# Patient Record
Sex: Female | Born: 1935 | Race: White | Hispanic: No | Smoking: Former smoker
Health system: Southern US, Community
[De-identification: ages and names within clinical notes are randomized; demographics above are authoritative.]

## PROBLEM LIST (undated history)

## (undated) DIAGNOSIS — N390 Urinary tract infection, site not specified: Secondary | ICD-10-CM

## (undated) DIAGNOSIS — R54 Age-related physical debility: Secondary | ICD-10-CM

## (undated) DIAGNOSIS — E119 Type 2 diabetes mellitus without complications: Secondary | ICD-10-CM

## (undated) HISTORY — DX: Age-related physical debility: R54

## (undated) HISTORY — DX: Type 2 diabetes mellitus without complications: E11.9

## (undated) HISTORY — DX: Urinary tract infection, site not specified: N39.0

---

## 2004-10-24 ENCOUNTER — Ambulatory Visit: Payer: Self-pay | Admitting: Internal Medicine

## 2004-11-12 ENCOUNTER — Ambulatory Visit: Payer: Self-pay | Admitting: Internal Medicine

## 2004-11-30 ENCOUNTER — Ambulatory Visit: Payer: Self-pay | Admitting: Ophthalmology

## 2004-12-06 ENCOUNTER — Ambulatory Visit: Payer: Self-pay | Admitting: Ophthalmology

## 2004-12-28 ENCOUNTER — Ambulatory Visit: Payer: Self-pay | Admitting: Internal Medicine

## 2005-06-20 ENCOUNTER — Ambulatory Visit: Payer: Self-pay | Admitting: Ophthalmology

## 2005-06-27 ENCOUNTER — Ambulatory Visit: Payer: Self-pay | Admitting: Ophthalmology

## 2005-10-30 ENCOUNTER — Other Ambulatory Visit: Payer: Self-pay

## 2005-10-30 ENCOUNTER — Ambulatory Visit: Payer: Self-pay | Admitting: Obstetrics and Gynecology

## 2005-11-02 ENCOUNTER — Ambulatory Visit: Payer: Self-pay | Admitting: Obstetrics and Gynecology

## 2005-12-04 ENCOUNTER — Inpatient Hospital Stay: Payer: Self-pay | Admitting: Specialist

## 2005-12-07 ENCOUNTER — Encounter: Payer: Self-pay | Admitting: Internal Medicine

## 2005-12-13 ENCOUNTER — Ambulatory Visit: Payer: Self-pay | Admitting: Internal Medicine

## 2005-12-13 ENCOUNTER — Encounter: Payer: Self-pay | Admitting: Internal Medicine

## 2006-01-14 ENCOUNTER — Encounter: Payer: Self-pay | Admitting: Specialist

## 2006-02-10 ENCOUNTER — Encounter: Payer: Self-pay | Admitting: Specialist

## 2006-08-26 ENCOUNTER — Emergency Department: Payer: Self-pay | Admitting: Internal Medicine

## 2007-01-21 ENCOUNTER — Encounter: Payer: Self-pay | Admitting: Rheumatology

## 2007-02-11 ENCOUNTER — Encounter: Payer: Self-pay | Admitting: Rheumatology

## 2007-09-03 ENCOUNTER — Ambulatory Visit (HOSPITAL_COMMUNITY): Admission: RE | Admit: 2007-09-03 | Discharge: 2007-09-03 | Payer: Self-pay | Admitting: Neurosurgery

## 2007-10-21 ENCOUNTER — Ambulatory Visit: Payer: Self-pay | Admitting: Otolaryngology

## 2008-02-11 ENCOUNTER — Inpatient Hospital Stay (HOSPITAL_COMMUNITY): Admission: RE | Admit: 2008-02-11 | Discharge: 2008-02-14 | Payer: Self-pay | Admitting: Neurosurgery

## 2008-06-09 ENCOUNTER — Ambulatory Visit: Payer: Self-pay | Admitting: Internal Medicine

## 2009-05-18 IMAGING — CR DG CHEST 2V
2 series · 2 of 2 positions shown · non-contrast
Comparison: None

CLINICAL DATA: Preoperative respiratory exam for lumbar stenosis.
History of hypertension, cough, short of breath and sleep apnea.

CHEST - 2 VIEW

[view not recorded (1 of 2)]
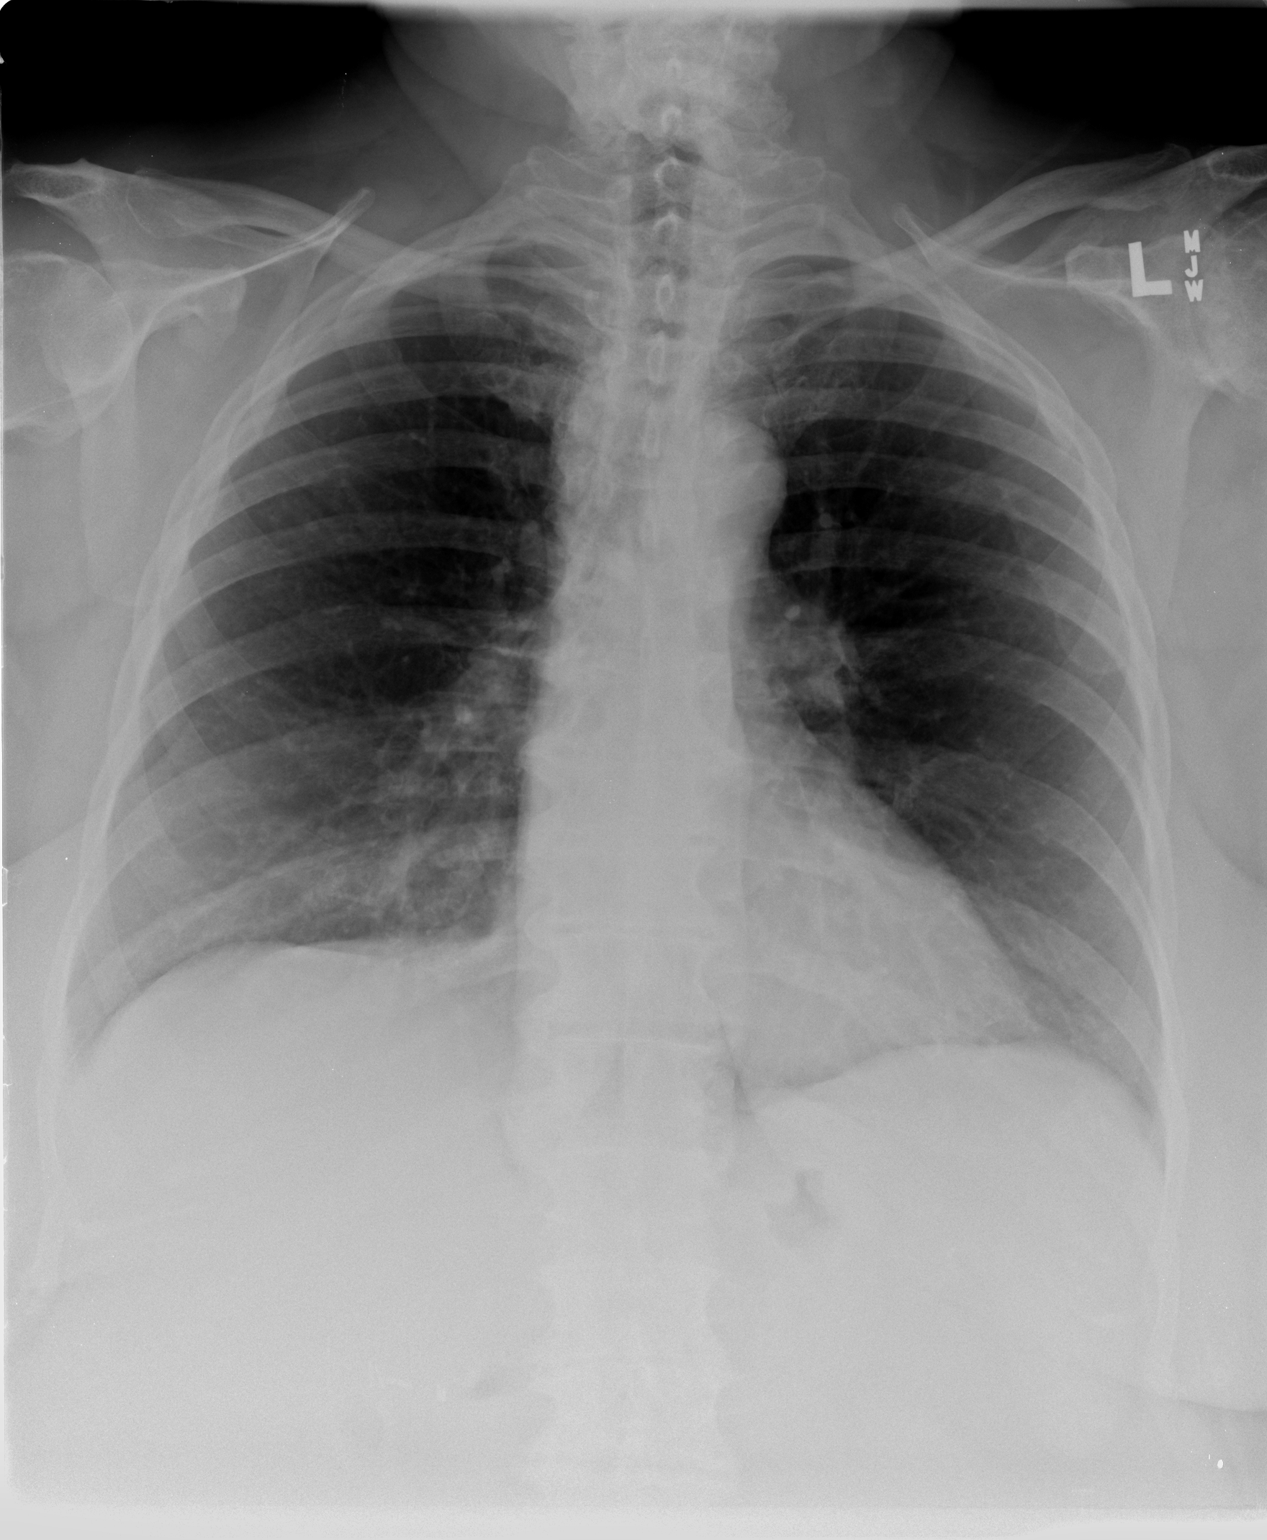

[view not recorded (2 of 2)]
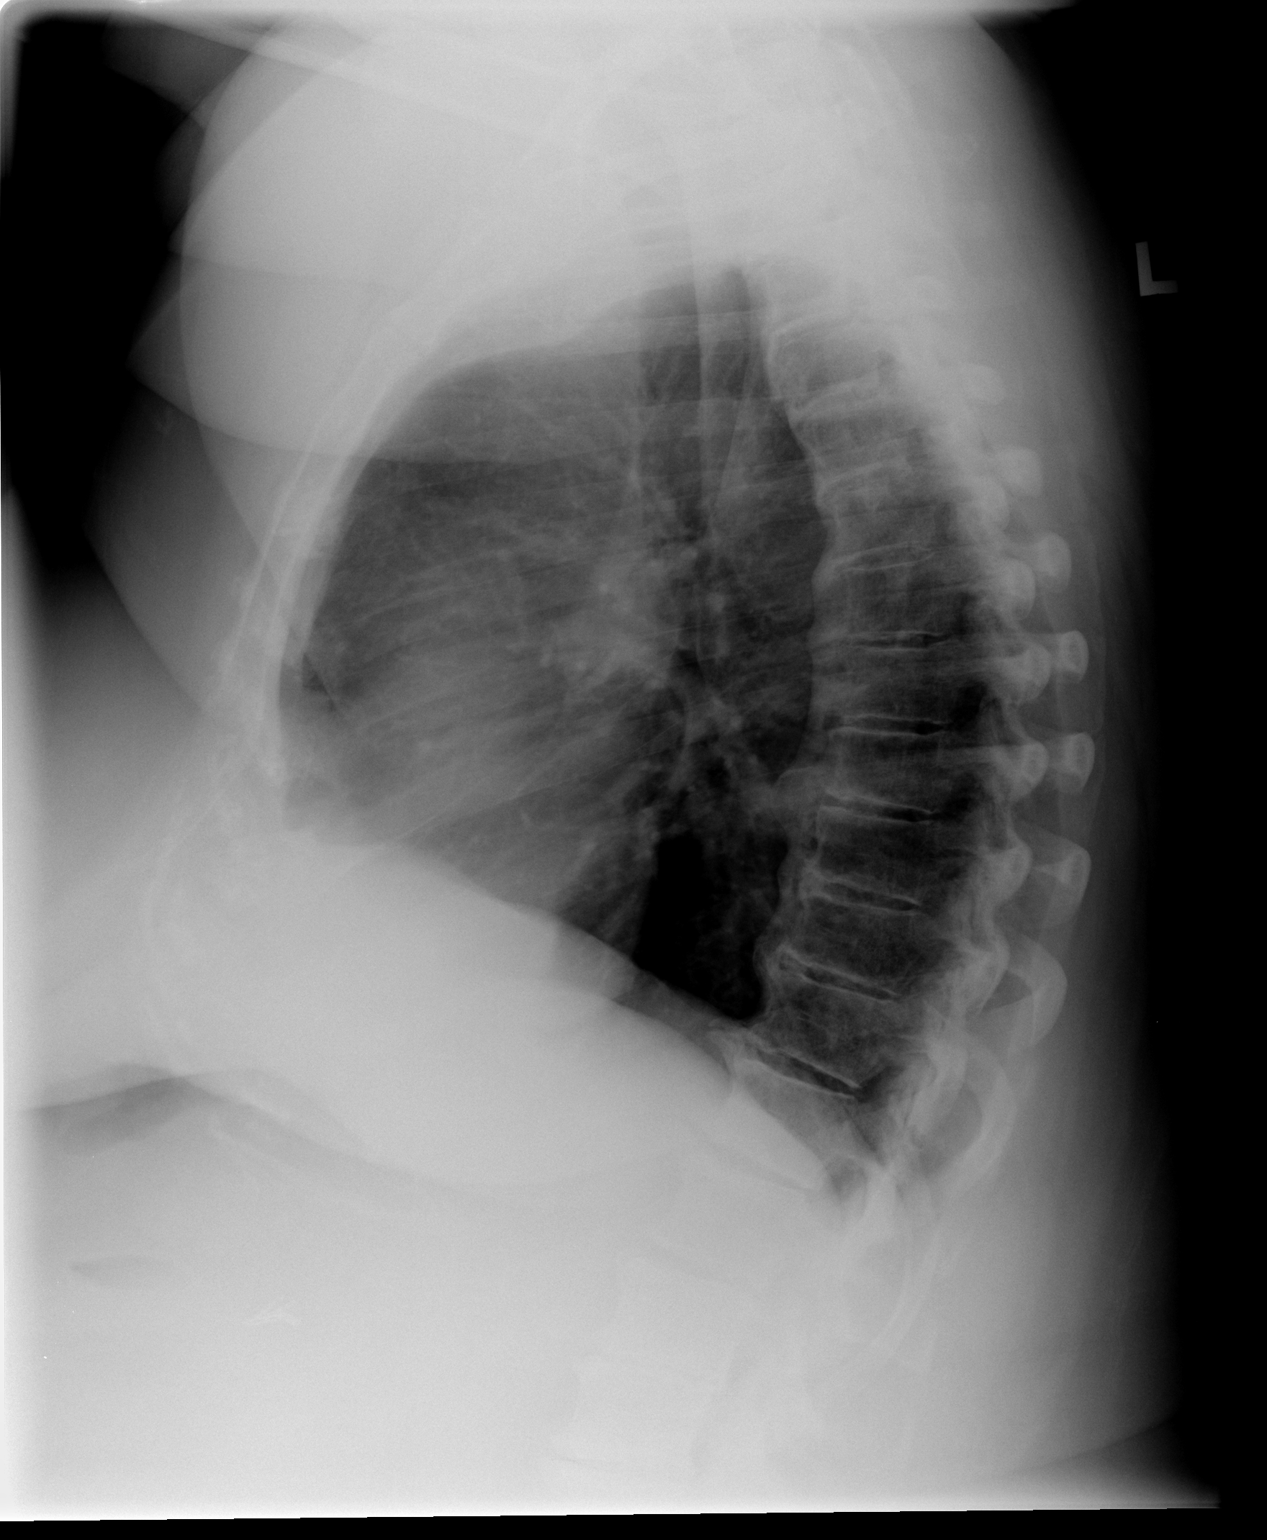

[2 of 2 positions shown; findings below may reference images not displayed]

FINDINGS: Heart size is normal.  The mediastinum is unremarkable.
The lungs are clear.  No effusions.  Ordinary degenerative changes
effect the spine.
IMPRESSION: No active disease

## 2010-10-30 ENCOUNTER — Ambulatory Visit: Payer: Self-pay | Admitting: Internal Medicine

## 2010-12-03 ENCOUNTER — Encounter: Payer: Self-pay | Admitting: Neurosurgery

## 2011-03-27 NOTE — Op Note (Signed)
Diana Carlson, Diana Carlson                 ACCOUNT NO.:  [id number]   MEDICAL RECORD NO.:  [id number]          PATIENT TYPE:  INP   LOCATION:  3007                         FACILITY:  [facility]   PHYSICIAN:  Cristi Loron, M.D.DATE OF BIRTH:  04/23/1934   DATE OF PROCEDURE:  02/11/2008  DATE OF DISCHARGE:                               OPERATIVE REPORT   BRIEF HISTORY:  The patient is a 76 year old white female who suffered  from back and leg pain consistent with neurogenic claudication.  She  failed medical management.  I discussed the various treatment options  with her including surgery.  The patient has weighed the risks,  benefits, and alternative surgery and decided to proceed with a  multilevel decompressive laminectomy.   PREOPERATIVE DIAGNOSES:  L2-3, L3-4, and L4-5 spinal stenosis, disk  degeneration, lumbar radiculopathy/myelopathy, and lumbago.   POSTOPERATIVE DIAGNOSES:  L2-3, L3-4, and L4-5 spinal stenosis, disk  degeneration, lumbar radiculopathy/myelopathy, and lumbago.   PROCEDURE:  L3, L4, and L5 laminectomy using microdissection to  decompress the bilateral L2, L3, L4, and L5 nerve roots using  microdissection.   SURGEON:  Cristi Loron, M.D.   ASSISTANT:  Stefani Dama, M.D.   ANESTHESIA:  General endotracheal.   ESTIMATED BLOOD LOSS:  150 cc.   SPECIMENS:  None.   DRAINS:  None.   COMPLICATIONS:  None.   DESCRIPTION OF PROCEDURE:  The patient was brought to the operating room  by anesthesia team.  General endotracheal anesthesia was induced.  The  patient was then carefully turned to the prone position on the Wilson  frame.  Her lumbosacral region was then prepared with Betadine scrub and  Betadine solution.  Sterile drapes were applied.  I then injected the  area to be incised with Marcaine with epinephrine solution and used a  scalpel to make a linear midline incision over the L2-3, L3-4, and L4-5  disk spaces.  I used electrocautery to perform a  bilateral subperiosteal  dissection exposing spinous process and lamina of L1 down to the sacrum.  We then obtained intraoperative radiograph to confirm our location.  We  then inserted the [facility] retractor for exposure.   I then used a scalpel to incise the interspinous ligament at L1-2, L2-3,  L3-4, and L4-5.  We used a Leksell rongeur to remove the spinous process  of L2, L3, and L4.  We then brought the operating microscope into the  field and under its magnification and illumination, we completed the  microdissection/decompression.  We used a high-speed drill to perform  bilateral L4, L3, and L2 laminotomies.  We completed the laminectomies  at L4, L3, and L2 using Kerrison punch and removed the ligamentum flavum  at L4-5, L3-4, L2-3, and L1-2 using the Kerrison punches.  The patient  had  severe multifactorial stenosis particularly at L3-4 and a lot of  lateral recess stenosis.  We then used a Kerrison punch to remove excess  ligamentum flavum from the lateral recesses and performed foraminotomies  about the bilateral L2, L3, L4, and L5 nerve roots.  After we were  satisfied  with the decompression, we then inspected the intervertebral  disk at L1-2, L2-3, L3-4, and L4-5 and noted that there was some bulging  particularly at L4-5 essentially to the right, but then there did not  seem to be any significant neural compression and therefore, we did not  perform an intervertebral discectomy.  At this point, we were satisfied  with the decompression.  We obtained hemostasis using bipolar  electrocautery.  We irrigated the wound out with bacitracin solution.  We then removed the retractor and then reapproximated the patient's  thoracolumbar fascia with interrupted #1 Vicryl suture, subcutaneous  tissue with interrupted 2-0 Vicryl suture, and the skin with Steri-  Strips and Benzoin.  The wound was then coated with bacitracin ointment  and sterile dressing was applied.  The drapes were  removed.  The patient  was subsequently returned to supine position where she was extubated by  the anesthesia team and transported to the postanesthesia care unit in  stable condition.  All sponge, instrument, and needle counts were  correct at the end of the case.      Cristi Loron, M.D.  Electronically Signed     JDJ/MEDQ  D:  02/11/2008  T:  02/12/2008  Job:  [id number]

## 2011-03-30 NOTE — Discharge Summary (Signed)
Diana Carlson, BLUETT                 ACCOUNT NO.:  [id number]   MEDICAL RECORD NO.:  [id number]          PATIENT TYPE:  INP   LOCATION:  3007                         FACILITY:  [facility]   PHYSICIAN:  Cristi Loron, M.D.DATE OF BIRTH:  Feb 06, 1934   DATE OF ADMISSION:  02/11/2008  DATE OF DISCHARGE:  02/14/2008                               DISCHARGE SUMMARY   BRIEF HISTORY:  The patient is a 77 year old white female who has  suffered from back and leg pain consistent with neurogenic claudication.  She has failed medical management.  I discussed the various treatment  options with her including surgery.  The patient is aware of the risks,  benefits, and alternatives to surgery and decided to proceed with a  multilevel decompressive laminectomy.   For further details of this admission, please refer to history and  physical.   HOSPITAL COURSE:  I admitted the patient to [facility] on February 11, 2008.  On the day of admission, I performed an L3, L4, and L5  laminectomy.  Surgery went well (for full details of this operation,  please refer to the operative note).   POSTOPERATIVE COURSE:  The patient's postoperative course was  unremarkable.  She was discharged to home on February 14, 2008.   DISCHARGE INSTRUCTIONS:  The patient was given written discharge  instructions.  Instructed to follow up with me in 4 weeks.   FINAL DIAGNOSES:  1. L2-L3, L3-L4, L4-L5 spinal stenosis.  2. Disc degeneration.  3. Lumbar radiculopathy.  4. Myelopathy.  5. Lumbago.   PROCEDURE PERFORMED:  L3, L4, and L5 laminectomy using microsection 2D  compression, bilateral L2, L3, L4, L5 nerve roots.      Cristi Loron, M.D.  Electronically Signed     JDJ/MEDQ  D:  03/11/2008  T:  03/12/2008  Job:  [id number]

## 2011-08-06 LAB — CBC
HCT: 41.5
Hemoglobin: 13.8
MCHC: 33.3
MCV: 88
Platelets: 294
RBC: 4.72
RDW: 14.3
WBC: 10.9 — ABNORMAL HIGH

## 2011-08-06 LAB — BASIC METABOLIC PANEL
BUN: 14
CO2: 28
Calcium: 9.4
Chloride: 100
Creatinine, Ser: 1.02
GFR calc Af Amer: >60
GFR calc non Af Amer: 53 — ABNORMAL LOW
Glucose, Bld: 261 — ABNORMAL HIGH
Potassium: 4.6
Sodium: 137

## 2013-08-19 ENCOUNTER — Ambulatory Visit: Payer: Self-pay | Admitting: Obstetrics and Gynecology

## 2013-09-16 ENCOUNTER — Ambulatory Visit: Payer: Self-pay | Admitting: Obstetrics and Gynecology

## 2014-04-29 DIAGNOSIS — E7800 Pure hypercholesterolemia, unspecified: Secondary | ICD-10-CM | POA: Insufficient documentation

## 2014-04-29 DIAGNOSIS — M170 Bilateral primary osteoarthritis of knee: Secondary | ICD-10-CM | POA: Insufficient documentation

## 2014-04-29 DIAGNOSIS — I152 Hypertension secondary to endocrine disorders: Secondary | ICD-10-CM | POA: Insufficient documentation

## 2014-04-29 DIAGNOSIS — E1159 Type 2 diabetes mellitus with other circulatory complications: Secondary | ICD-10-CM | POA: Insufficient documentation

## 2014-05-02 DIAGNOSIS — N183 Chronic kidney disease, stage 3 unspecified: Secondary | ICD-10-CM | POA: Insufficient documentation

## 2014-05-02 DIAGNOSIS — D631 Anemia in chronic kidney disease: Secondary | ICD-10-CM | POA: Insufficient documentation

## 2014-05-02 DIAGNOSIS — E538 Deficiency of other specified B group vitamins: Secondary | ICD-10-CM | POA: Insufficient documentation

## 2014-11-08 DIAGNOSIS — B351 Tinea unguium: Secondary | ICD-10-CM | POA: Insufficient documentation

## 2015-11-08 DIAGNOSIS — E559 Vitamin D deficiency, unspecified: Secondary | ICD-10-CM | POA: Insufficient documentation

## 2015-11-08 DIAGNOSIS — Z6841 Body Mass Index (BMI) 40.0 and over, adult: Secondary | ICD-10-CM | POA: Insufficient documentation

## 2016-02-02 DIAGNOSIS — R600 Localized edema: Secondary | ICD-10-CM | POA: Insufficient documentation

## 2016-07-17 ENCOUNTER — Other Ambulatory Visit: Payer: Self-pay | Admitting: Obstetrics and Gynecology

## 2016-07-17 DIAGNOSIS — Z1231 Encounter for screening mammogram for malignant neoplasm of breast: Secondary | ICD-10-CM

## 2016-07-25 ENCOUNTER — Ambulatory Visit: Payer: Self-pay

## 2016-07-25 ENCOUNTER — Ambulatory Visit
Admission: RE | Admit: 2016-07-25 | Discharge: 2016-07-25 | Disposition: A | Discharge status: Home / Self Care | Payer: Medicare Other | Source: Ambulatory Visit | Attending: Obstetrics and Gynecology | Admitting: Obstetrics and Gynecology

## 2016-07-25 DIAGNOSIS — Z1231 Encounter for screening mammogram for malignant neoplasm of breast: Secondary | ICD-10-CM | POA: Insufficient documentation

## 2019-06-12 DIAGNOSIS — M1990 Unspecified osteoarthritis, unspecified site: Secondary | ICD-10-CM | POA: Insufficient documentation

## 2019-06-12 DIAGNOSIS — G4733 Obstructive sleep apnea (adult) (pediatric): Secondary | ICD-10-CM | POA: Insufficient documentation

## 2019-06-12 DIAGNOSIS — G8929 Other chronic pain: Secondary | ICD-10-CM | POA: Insufficient documentation

## 2020-09-03 DIAGNOSIS — E041 Nontoxic single thyroid nodule: Secondary | ICD-10-CM | POA: Insufficient documentation

## 2020-11-12 DIAGNOSIS — M1030 Gout due to renal impairment, unspecified site: Secondary | ICD-10-CM | POA: Insufficient documentation

## 2020-11-12 DIAGNOSIS — K5900 Constipation, unspecified: Secondary | ICD-10-CM | POA: Insufficient documentation

## 2020-11-12 DIAGNOSIS — Z794 Long term (current) use of insulin: Secondary | ICD-10-CM | POA: Insufficient documentation

## 2020-11-12 DIAGNOSIS — Z87440 Personal history of urinary (tract) infections: Secondary | ICD-10-CM | POA: Insufficient documentation

## 2021-02-26 DIAGNOSIS — R591 Generalized enlarged lymph nodes: Secondary | ICD-10-CM | POA: Insufficient documentation

## 2021-03-22 ENCOUNTER — Other Ambulatory Visit: Payer: Self-pay | Admitting: Gerontology

## 2021-03-22 DIAGNOSIS — R1310 Dysphagia, unspecified: Secondary | ICD-10-CM

## 2021-04-11 ENCOUNTER — Other Ambulatory Visit: Payer: Self-pay

## 2021-04-11 ENCOUNTER — Ambulatory Visit: Payer: Medicare Other

## 2021-04-11 ENCOUNTER — Ambulatory Visit
Admission: RE | Admit: 2021-04-11 | Discharge: 2021-04-11 | Disposition: A | Discharge status: Home / Self Care | Payer: Medicare Other | Source: Ambulatory Visit | Attending: Gerontology | Admitting: Gerontology

## 2021-04-11 DIAGNOSIS — R1310 Dysphagia, unspecified: Secondary | ICD-10-CM | POA: Diagnosis not present

## 2021-04-11 NOTE — Therapy (Signed)
[name] [address] [place], [place], [address] Phone: [phone]   Fax:     Modified Barium Swallow  Patient Details  Name: Diana Carlson MRN: [id number] Date of Birth: 1934/10/25 No data recorded  Encounter Date: 04/11/2021   End of Session - 04/11/21 1356    Visit Number 1    Number of Visits 1    Date for SLP Re-Evaluation 04/11/21    SLP Start Time 1255    SLP Stop Time  1320    SLP Time Calculation (min) 25 min    Activity Tolerance Patient tolerated treatment well           No past medical history on file.  No past surgical history on file.  There were no vitals filed for this visit.   Subjective Assessment - 04/11/21 1322    Subjective Complains of phlegm and coughing especially when lying down    Currently in Pain? No/denies             Objective Swallowing Evaluation: Type of Study: MBS-Modified Barium Swallow Study   Patient Details  Name: Diana Carlson MRN: [id number] Date of Birth: 11-Jul-1934  Today's Date: 04/11/2021 Time: SLP Start Time (ACUTE ONLY): 1255 -SLP Stop Time (ACUTE ONLY): 1320  SLP Time Calculation (min) (ACUTE ONLY): 25 min   Past Medical History: No past medical history on file. Past Surgical History: No past surgical history on file. HPI: Patient is an 87 year old female with past medical history noted for GERD, DM, esophageal ulcers, thyroid nodules, Covid-19 with 2 week hospitalization in 08/2020 for respiratory failure and encephalopathy, recent hospitalization for AMS, UTI on 02/23/21. Pt was evaluated by SLP in ED at [facility] with findings of grossly functional oropharyngeal swallow. CT head on 02/26/21 showed old lacunar infarct, mild-moderate cerebral atrophy. Referred by Lorenso Quarry, NP due to mucus, clearing throat, coughing/ choking episodes (worse with food vs liquid).   Subjective: coughs up foods occasionally (maybe once a day)    Assessment / Plan /  Recommendation  CHL IP CLINICAL IMPRESSIONS 04/11/2021  Clinical Impression Patient presents with grossly functional oropharyngeal swallow with no penetration or aspiration observed on this examination. Oral stage is noted for adequate lip closure, bolus preparation and transfer. Swallow initiation is mildly delayed to the level of the pyriform sinuses with liquids (valleculae with solids), however this is grossly functional given pt's age and history of reflux which can impact timing of swallow initiation, and the fact that she protects her airway during the swallow adequately. Pharyngeal stage is characterized by adequate tongue base retraction, hyolaryngeal excursion, and pharyngeal constriction. Airway closure is complete with no penetration or aspiration. Pharyngeal stripping wave is mildly decreased due to prominent cricopharyngeus, which does result in mild residue with consecutive swallows of thin liquid in the valleculae and post-cricoid region. Otherwise cricopharyngeus did not significantly impede bolus flow and there was no other pharyngeal residue observed. Cervical esophageal clearance was difficult to assess given pt body habitus and pt's shoulder obstructing view, however no contrast was visible in the cervical esophagus in limited view. Reviewed study findings and recommendations for regular diet, thin along with reflux precautions and behavioral/dietary modifications should this aid symptoms, including upright during and for at least 30 minutes after meals, alternating solids and liquids. Suspect pt symptoms may be attributable to primary esophageal dysphagia, therefore recommend follow-up with GI with consideration of esophagram to further assess for reflux/motility issues given pt's history.  SLP Visit Diagnosis Dysphagia, oropharyngeal phase (R13.12)  Attention and concentration deficit following --  Frontal lobe and executive function deficit following --  Impact on safety and function  Mild aspiration risk      CHL IP TREATMENT RECOMMENDATION 04/11/2021  Treatment Recommendations No treatment recommended at this time     No flowsheet data found.  CHL IP DIET RECOMMENDATION 04/11/2021  SLP Diet Recommendations Regular solids;Thin liquid  Liquid Administration via Cup  Medication Administration Whole meds with liquid  Compensations Slow rate;Small sips/bites;Follow solids with liquid  Postural Changes Seated upright at 90 degrees;Remain semi-upright after after feeds/meals (Comment)      CHL IP OTHER RECOMMENDATIONS 04/11/2021  Recommended Consults Consider GI evaluation;Consider esophageal assessment  Oral Care Recommendations Oral care BID  Other Recommendations --      CHL IP FOLLOW UP RECOMMENDATIONS 04/11/2021  Follow up Recommendations None      No flowsheet data found.         CHL IP ORAL PHASE 04/11/2021  Oral Phase WFL    CHL IP PHARYNGEAL PHASE 04/11/2021  Pharyngeal Phase WFL  Pharyngeal- Nectar Cup [facility]  Pharyngeal Material does not enter airway  Pharyngeal- Thin Cup [facility]  Pharyngeal Material does not enter airway  Pharyngeal- Puree WFL  Pharyngeal Material does not enter airway  Pharyngeal- Mechanical Soft WFL  Pharyngeal Material does not enter airway     CHL IP CERVICAL ESOPHAGEAL PHASE 04/11/2021  Cervical Esophageal Phase Impaired  Thin Cup Prominent cricopharyngeal segment;Other (Comment)   Rondel Baton, MS, CCC-SLP Speech-Language Pathologist  Diana Carlson 04/11/2021, 1:57 PM                             Dysphagia, unspecified type - Plan: DG SWALLOW FUNC OP MEDICARE SPEECH PATH, DG SWALLOW FUNC OP MEDICARE SPEECH PATH        Problem List There are no problems to display for this patient.   Diana Carlson 04/11/2021, 1:57 PM  [facility] [address] [place], [place], [address] Phone: [phone]   Fax:     Name: Diana Carlson MRN:  [id number] Date of Birth: 20-Aug-1934

## 2021-07-28 DIAGNOSIS — E1122 Type 2 diabetes mellitus with diabetic chronic kidney disease: Secondary | ICD-10-CM | POA: Insufficient documentation

## 2021-08-24 ENCOUNTER — Telehealth: Payer: Self-pay | Admitting: Primary Care

## 2021-08-24 NOTE — Telephone Encounter (Signed)
Attempted to contact patient's granddaughter, Laquida Cotrell to offer to schedule a Palliative Consult, no answer - left message with reason for call along with my name and call back number.

## 2021-08-29 ENCOUNTER — Telehealth: Payer: Self-pay

## 2021-08-29 NOTE — Telephone Encounter (Signed)
Spoke with patient's granddaughter Charlynn Court and scheduled an in-person Palliative Consult for 09/14/21 @ 12:30 PM.  COVID screening was negative. Two dogs in the home will be crated. Patient lives with granddaughter.  Consent obtained; updated Outlook/Netsmart/Team List and Epic.   Family is aware they may be receiving a call from provider the day before or day of to confirm appointment.

## 2021-09-14 ENCOUNTER — Other Ambulatory Visit: Payer: Self-pay

## 2021-09-14 ENCOUNTER — Encounter: Payer: Self-pay | Admitting: Primary Care

## 2021-09-14 ENCOUNTER — Other Ambulatory Visit: Payer: Medicare Other | Admitting: Primary Care

## 2021-09-14 VITALS — Ht 62.0 in | Wt 185.0 lb

## 2021-09-14 DIAGNOSIS — G4733 Obstructive sleep apnea (adult) (pediatric): Secondary | ICD-10-CM

## 2021-09-14 DIAGNOSIS — G8929 Other chronic pain: Secondary | ICD-10-CM

## 2021-09-14 DIAGNOSIS — Z515 Encounter for palliative care: Secondary | ICD-10-CM

## 2021-09-14 DIAGNOSIS — E1122 Type 2 diabetes mellitus with diabetic chronic kidney disease: Secondary | ICD-10-CM

## 2021-09-14 DIAGNOSIS — E559 Vitamin D deficiency, unspecified: Secondary | ICD-10-CM

## 2021-09-14 DIAGNOSIS — N183 Chronic kidney disease, stage 3 unspecified: Secondary | ICD-10-CM

## 2021-09-14 DIAGNOSIS — E1159 Type 2 diabetes mellitus with other circulatory complications: Secondary | ICD-10-CM

## 2021-09-14 NOTE — Progress Notes (Signed)
[facility] Pall[facility] Care Consult Note Telephone: [phone]  Fax: [phone]   Date of encounter: 09/14/21 12:54 PM PATIENT NAME: Diana Carlson [place] [address]   [phone] (home)  DOB: December 18, 1933 MRN: [id number] PRIMARY CARE PROVIDER:    Sallee Lange, NP [address] [place] [address] [phone]   REFERRING PROVIDER:   Sallee Lange, NP [address] [place],  [place] [address] [phone]  RESPONSIBLE PARTY:    Contact Information     Name Relation Home Work [place] Granddaughter [phone]         I met face to face with patient and family in  home. Palliative Care was asked to follow this patient by consultation request of  Diana Carlson, Diana Carlson, * to address advance care planning and complex medical decision making. This is the initial visit.                                     ASSESSMENT AND PLAN / RECOMMENDATIONS:   Advance Care Planning/Goals of Care: Goals include to maximize quality of life and symptom management. Patient/health care surrogate gave his/her permission to discuss.Our advance care planning conversation included a discussion about:    The value and importance of advance care planning  Experiences with loved ones who have been seriously ill or have died - daughter has died, mother of her granddaughter care giver. Exploration of personal, cultural or spiritual beliefs that might influence medical decisions  Exploration of goals of care in the event of a sudden injury or illness  Identification  of a healthcare agent - granddaughter is HCPOA. Discussed other needs for durable poa. Discussed her wishes to not be prolonged if her outcome was not compatible with a good quality of life. creation of an advance directive document . Decision not to resuscitate  due to poor prognosis. CODE STATUS: DNR  I completed a MOST form today. The patient and family outlined  their wishes for the following treatment decisions:  Cardiopulmonary Resuscitation: Do Not Attempt Resuscitation (DNR/No CPR)  Medical Interventions: Comfort Measures: Keep clean, warm, and dry. Use medication by any route, positioning, wound care, and other measures to relieve pain and suffering. Use oxygen, suction and manual treatment of airway obstruction as needed for comfort. Do not transfer to the hospital unless comfort needs cannot be met in current location.  Antibiotics: Antibiotics if indicated  IV Fluids: No IV fluids (provide other measures to ensure comfort)  Feeding Tube: No feeding tube   I spent 30 minutes providing this consultation. More than 50% of the time in this consultation was spent in counseling and care coordination. --------------------------------------------------------------------------------------------------------------------------------  Symptom Management/Plan:  Immobility: Endorses loss of function after covid infection and subsequent debility x 10 months. Had been in rehab following covid, came home in 12/22. Moved in with granddaughter and her great granddaughter is now in her home. Discussed her immobility and needs for assistance with adls and iadls.  Currently has ongoing home health. Has had chronic UTI and this have been debilitating from a motor stand point.  Skin integrity: Reports chronic areas of excoriation due to skin on skin pressure, heat and moisture. Endorses past yeast infections. I Recommended interdry for intertriginous areas, also looking at bidet  device to add in for perineal cleansing.This is available to be put between skin folds, with ends extending  to wick out moisture, and decrease skin degradation from moisture.  Diabetes: Latest A1C 6.6%, checks cbg infrequently. Complaint with diet and medications.  Pain: Occasional, Rx with tramadol and acetaminophen. Denies any increased pain or needs to change protocol at this time.  UTI: Has  had 4 UTIs in the past year, and these cause her to become very debilitated. I Recommend better perineal cleansing, drying and use of D- mannose, oral supplements may consider estrogen cream. Last UTI was in late 9/22. Consider a referral to [facility] if the above measures do not control chronic recurrence. Patient endorses difficulty at self care for hygiene. Follow up Palliative Care Visit: Palliative care will continue to follow for complex medical decision making, advance care planning, and clarification of goals. Return 4 weeks or prn.  This visit was coded based on medical decision making (MDM).  PPS: 40%  HOSPICE ELIGIBILITY/DIAGNOSIS: no  Chief Complaint: debility, skin irritation, chronic uti  HISTORY OF PRESENT ILLNESS:  Diana Carlson is a 87 y.o. year old female  with DM, obesity, immobility, deconditioning, chronic uti. Presents today with immobility due to deconditioning, and lack of ability to perform adls (I). She lives with granddaughter who assists her. 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 She ambulates with walker and needs assistance with all alds, can feed self, and needs assistance with all iadls.  UTI frequently results in her increased debility and long recovery period. UTI seems to be related to perineal hygiene. Patient has used some assistance, and granddaughter is going to purchase some additional cleaning devices. Patient has not tired d -mannoes or probiotics which may help even out her gut and gi flora.   History obtained from review of EMR, discussion with primary team, and interview with family, facility staff/caregiver and/or Ms. Diana Carlson.  I reviewed available labs, medications, imaging, studies and related documents from the EMR.  Records reviewed and summarized above.   ROS  General: NAD EYES: denies vision changes ENMT: denies dysphagia Cardiovascular: denies chest  pain, denies DOE Pulmonary: denies cough, denies increased SOB Abdomen: endorses good appetite, denies constipation, endorses continence of bowel GU: denies dysuria, endorses continence of urine MSK:  denies weakness,  no falls reported Skin: denies rashes or wounds Neurological: denies pain, denies insomnia Psych: Endorses positive mood Heme/lymph/immuno: denies bruises, abnormal bleeding  Physical Exam: Current and past weights: 185 lbs, Body mass index is 33.84 kg/m. Constitutional: NAD General: frail appearing, obese  EYES: anicteric sclera, lids intact, no discharge  ENMT: intact hearing, oral mucous membranes moist, dentition missing CV: S1S2, RRR, slight  LE edema Pulmonary: LCTA, no increased work of breathing, no cough, room air Abdomen: intake 100%, normo-active BS + 4 quadrants, soft and non tender, no ascites MSK: mild  sarcopenia, moves all extremities, ambulatory with walker  Skin: warm and dry, no rashes or wounds on visible skin Neuro:  + generalized weakness,  mild  cognitive impairment Psych: non-anxious affect, A and O x 3 Hem/lymph/immuno: no widespread bruising CURRENT PROBLEM LIST:  Patient Active Problem List   Diagnosis Date Noted   Type 2 diabetes mellitus with diabetic chronic kidney disease ([place]) 07/28/2021   Lymphadenopathy of head and neck 02/26/2021   Constipation, unspecified 11/12/2020   Gout due to renal impairment, unspecified site 11/12/2020   Long term (current) use of insulin ([place]) 11/12/2020   Personal history of urinary (tract) infections 11/12/2020  Nontoxic single thyroid nodule 09/03/2020   Chronic pain 06/12/2019   Osteoarthritis 06/12/2019   OSA (obstructive sleep apnea) 06/12/2019   Bilateral edema of lower extremity 02/02/2016   BMI 40.0-44.9, adult ([place]) 11/08/2015   Vitamin D deficiency, unspecified 11/08/2015   Onychomycosis of toenail 11/08/2014   Anemia due to stage 3 chronic kidney disease ([place]) 05/02/2014   B12  deficiency 05/02/2014   Hypertension associated with type 2 diabetes mellitus ([place]) 04/29/2014   Osteoarthritis of both knees 04/29/2014   Pure hypercholesterolemia 04/29/2014    PAST MEDICAL HISTORY:  Active Ambulatory Problems    Diagnosis Date Noted   Constipation, unspecified 11/12/2020   Gout due to renal impairment, unspecified site 11/12/2020   Long term (current) use of insulin ([place]) 11/12/2020   Personal history of urinary (tract) infections 11/12/2020   Type 2 diabetes mellitus with diabetic chronic kidney disease ([place]) 07/28/2021   Anemia due to stage 3 chronic kidney disease ([place]) 05/02/2014   B12 deficiency 05/02/2014   Bilateral edema of lower extremity 02/02/2016   BMI 40.0-44.9, adult ([place]) 11/08/2015   Chronic pain 06/12/2019   Hypertension associated with type 2 diabetes mellitus ([place]) 04/29/2014   Nontoxic single thyroid nodule 09/03/2020   Onychomycosis of toenail 11/08/2014   Osteoarthritis 06/12/2019   Osteoarthritis of both knees 04/29/2014   OSA (obstructive sleep apnea) 06/12/2019   Pure hypercholesterolemia 04/29/2014   Vitamin D deficiency, unspecified 11/08/2015   Lymphadenopathy of head and neck 02/26/2021   Resolved Ambulatory Problems    Diagnosis Date Noted   No Resolved Ambulatory Problems   Past Medical History:  Diagnosis Date   Age-related physical debility    Diabetes mellitus with insulin therapy ([place])    Frequent UTI      SOCIAL HX:  Social History   Socioeconomic History   Marital status: Widowed    Spouse name: Not on file   Number of children: Not on file   Years of education: Not on file   Highest education level: Not on file  Occupational History   Not on file  Tobacco Use   Smoking status: Former    Packs/day: 1.00    Years: 10.00    Pack years: 10.00    Types: Cigarettes    Start date: 33    Quit date: 1968    Years since quitting: 54.8   Smokeless tobacco: Never  Substance and Sexual Activity   Alcohol  use: Not on file   Drug use: Never   Sexual activity: Not Currently  Other Topics Concern   Not on file  Social History Narrative   Not on file   Social Determinants of Health   Financial Resource Strain: Not on file  Food Insecurity: Not on file  Transportation Needs: Not on file  Physical Activity: Not on file  Stress: Not on file  Social Connections: Not on file  Intimate Partner Violence: Not on file     FAMILY HX:  Family History  Problem Relation Age of Onset   Cancer Daughter    Breast cancer Neg Hx    ALLERGIES:  Allergies  Allergen Reactions   Shrimp Extract Allergy Skin Test Nausea Only and Nausea And Vomiting    PERTINENT MEDICATIONS:  Outpatient Encounter Medications as of 09/14/2021  Medication Sig   albuterol (VENTOLIN HFA) 108 (90 Base) MCG/ACT inhaler Inhale 2 puffs into the lungs every 4 (four) hours as needed.   allopurinol (ZYLOPRIM) 300 MG tablet Take 300 mg by mouth daily.  aspirin 81 MG chewable tablet Chew 1 tablet by mouth daily.   Cholecalciferol 1.25 MG (50000 UT) TABS Take 1,250 mcg by mouth daily.   FLUoxetine (PROZAC) 10 MG capsule Take 1 capsule by mouth daily.   FLUTICASONE PROPIONATE, NASAL, NA Place 1 spray into the nose daily.   insulin glargine (LANTUS) 100 UNIT/ML injection Inject 20 Units into the skin daily.   lisinopril (ZESTRIL) 20 MG tablet Take 10 mg by mouth daily.   Magnesium 400 MG TABS Take 1 tablet by mouth daily.   Melatonin 3 MG CAPS Take by mouth daily.   traMADol (ULTRAM) 50 MG tablet Take 1 tablet by mouth every 6 (six) hours as needed. Prn General pain   vitamin B-12 (CYANOCOBALAMIN) 500 MCG tablet Take 1 tablet by mouth daily.   No facility-administered encounter medications on file as of 09/14/2021.    Thank you for the opportunity to participate in the care of Ms. Jabs.  The palliative care team will continue to follow. Please call our office at [phone] if we can be of additional assistance.   Jason Coop, NP , DNP, AGPCNP-BC  COVID-19 PATIENT SCREENING TOOL Asked and negative response unless otherwise noted:  Have you had symptoms of covid, tested positive or been in contact with someone with symptoms/positive test in the past 5-10 days?

## 2021-10-12 ENCOUNTER — Other Ambulatory Visit: Payer: Self-pay

## 2021-10-12 ENCOUNTER — Other Ambulatory Visit: Payer: Medicare Other | Admitting: Primary Care

## 2021-10-12 DIAGNOSIS — E1122 Type 2 diabetes mellitus with diabetic chronic kidney disease: Secondary | ICD-10-CM

## 2021-10-12 DIAGNOSIS — Z87440 Personal history of urinary (tract) infections: Secondary | ICD-10-CM

## 2021-10-12 DIAGNOSIS — Z794 Long term (current) use of insulin: Secondary | ICD-10-CM

## 2021-10-12 DIAGNOSIS — Z6841 Body Mass Index (BMI) 40.0 and over, adult: Secondary | ICD-10-CM

## 2021-10-12 DIAGNOSIS — Z515 Encounter for palliative care: Secondary | ICD-10-CM

## 2021-10-12 NOTE — Progress Notes (Signed)
[facility] Pall[facility] Consult Note Telephone: [phone]  Fax: [phone]    Date of encounter: 10/12/21 1:37 PM PATIENT NAME: Diana Carlson [place] [address]   [phone] (home)  DOB: Jan 19, 1934 MRN: [id number] PRIMARY CARE PROVIDER:    Sallee Lange, NP,  [address] [place] [address] [phone]  REFERRING PROVIDER:   Sallee Lange, NP,  [address] [place] [address] [phone]  RESPONSIBLE PARTY:    Contact Information     Name Relation Home Work [place], [place] Granddaughter [phone]         I met face to face with patient and family in  home. Palliative Care was asked to follow this patient by consultation request of  Gauger, Victoriano Lain, NP  to address advance care planning and complex medical decision making. This is a follow up visit.                                   ASSESSMENT AND PLAN / RECOMMENDATIONS:   Advance Care Planning/Goals of Care: Goals include to maximize quality of life and symptom management. Our advance care planning conversation included a discussion about:    Exploration of personal, cultural or spiritual beliefs that might influence medical decisions  Identification of a healthcare agent  CODE STATUS: DNR  Symptom Management/Plan:  Mobility: Endorses using walker, no falls.  Able to ambulate in home for adls. Does own adls and can do meal prep.  UTI; Has not gotten D [place], but has not had recurrence of UTI.  Drinking water for prevention  Appetite: Eating well, weight stable for now. Checks BG on occasion. Will start checking more. Endorses (I) with meals  Follow up Palliative Care Visit: Palliative care will continue to follow for complex medical decision making, advance care planning, and clarification of goals. Return 12 weeks or prn.  I spent 40 minutes providing this consultation. More than 50% of the time in this consultation  was spent in counseling and care coordination.  PPS: 40%  HOSPICE ELIGIBILITY/DIAGNOSIS: TBD  Chief Complaint: debility  HISTORY OF PRESENT ILLNESS:  Diana Carlson is a 87 y.o. year old female  with DM, insulin dependent, immobility.  History obtained from review of EMR, discussion with primary team, and interview with family, facility staff/caregiver and/or Ms. Bento.  I reviewed available labs, medications, imaging, studies and related documents from the EMR.  Records reviewed and summarized above.   ROS  General: NAD EYES: denies vision changes ENMT: denies dysphagia Cardiovascular: denies chest pain, denies DOE Pulmonary: denies cough, denies increased SOB Abdomen: endorses good appetite, denies constipation, endorses continence of bowel GU: denies dysuria, endorses continence of urine MSK:  endorses mild weakness,  no falls reported Skin: denies rashes or wounds Neurological: endorses occ pain, denies insomnia Psych: Endorses positive mood Heme/lymph/immuno: denies bruises, abnormal bleeding  Physical Exam: Current and past weights: 185 lbs Constitutional: NAD General: frail appearing, obese  EYES: anicteric sclera, lids intact, no discharge  ENMT: intact hearing, oral mucous membranes moist, dentition intact CV: no LE edema Pulmonary:no increased work of breathing, no cough, room air Abdomen: intake 100%, no ascites GU: deferred MSK: moderate  sarcopenia, moves all extremities, ambulatory with walker  Skin: warm and dry, no rashes or wounds on visible skin Neuro:  + generalized weakness,  no cognitive impairment Psych: non-anxious affect, A  and O x 3 Hem/lymph/immuno: no widespread bruising  Thank you for the opportunity to participate in the care of Ms. Califano.  The palliative care team will continue to follow. Please call our office at [phone] if we can be of additional assistance.   Jason Coop, NP DNP, AGPCNP-BC  COVID-19 PATIENT SCREENING  TOOL Asked and negative response unless otherwise noted:   Have you had symptoms of covid, tested positive or been in contact with someone with symptoms/positive test in the past 5-10 days?

## 2022-01-09 ENCOUNTER — Other Ambulatory Visit: Payer: Medicare Other | Admitting: Primary Care

## 2022-03-23 ENCOUNTER — Telehealth: Payer: Self-pay

## 2022-03-23 NOTE — Telephone Encounter (Signed)
1227 pm.  Phone call made to [facility]-granddaughter to check on patient status.  No answer.  Message left requesting a call back.  ?

## 2022-05-17 ENCOUNTER — Other Ambulatory Visit: Payer: Medicare Other

## 2022-05-17 DIAGNOSIS — Z515 Encounter for palliative care: Secondary | ICD-10-CM

## 2022-05-17 NOTE — Progress Notes (Signed)
PATIENT NAME: Diana Carlson DOB: Jun 14, 1934 MRN: [id number]  PRIMARY CARE PROVIDER: Myrene Buddy, NP  RESPONSIBLE PARTY:  Acct ID - Guarantor Home Phone Work Phone Relationship Acct Type  [id number] [name], [name] [phone]  Self P/F     [address] [place], [place] [address]    Connected with Sharita-granddaughter to complete a telephonic visit.   Patient is living with granddaughter who is also primary caregiver.  Overall, Diana Carlson feels patient is doing well.  Biggest concern is fluid intake.  Patient has 48-64 oz bottle of water that stays beside her but she needs lots of encouragement to consume liquids.  Po intake has decreased slightly and patient tends to eat more sweets.  Diana Carlson is working to decrease sweet intake but increase some fruits for patient.  Blood sugars are checked weekly now.  Readings are WNL.  Diana Carlson declines a home visit at this time but advised she will contact Palliative Care provider when needed.   Update provided to Diana Sorrel, NP.    Truitt Merle, RN

## 2023-08-13 DEATH — deceased
